# Patient Record
Sex: Female | Born: 2005 | Race: Black or African American | Hispanic: No | Marital: Single | Smoking: Never smoker
Health system: Southern US, Community
[De-identification: ages and names within clinical notes are randomized; demographics above are authoritative.]

---
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
## 2025-02-26 ENCOUNTER — Encounter: Admitting: Nurse Practitioner
